# Patient Record
Sex: Male | Born: 1985 | Race: White | Hispanic: No | Marital: Married | Smoking: Never smoker
Health system: Southern US, Community
[De-identification: ages and names within clinical notes are randomized; demographics above are authoritative.]

---

## 2008-12-09 ENCOUNTER — Emergency Department (HOSPITAL_COMMUNITY): Admission: EM | Admit: 2008-12-09 | Discharge: 2008-12-09 | Payer: Self-pay | Admitting: Emergency Medicine

## 2011-04-09 ENCOUNTER — Emergency Department (HOSPITAL_COMMUNITY)
Admission: EM | Admit: 2011-04-09 | Discharge: 2011-04-09 | Disposition: A | Discharge status: Home / Self Care | Payer: Self-pay | Attending: Emergency Medicine | Admitting: Emergency Medicine

## 2011-04-09 DIAGNOSIS — W57XXXA Bitten or stung by nonvenomous insect and other nonvenomous arthropods, initial encounter: Secondary | ICD-10-CM | POA: Insufficient documentation

## 2011-04-09 DIAGNOSIS — R21 Rash and other nonspecific skin eruption: Secondary | ICD-10-CM | POA: Insufficient documentation

## 2011-04-09 DIAGNOSIS — R509 Fever, unspecified: Secondary | ICD-10-CM | POA: Insufficient documentation

## 2011-04-09 DIAGNOSIS — T148 Other injury of unspecified body region: Secondary | ICD-10-CM | POA: Insufficient documentation

## 2011-04-09 DIAGNOSIS — R197 Diarrhea, unspecified: Secondary | ICD-10-CM | POA: Insufficient documentation

## 2011-04-09 LAB — DIFFERENTIAL
Basophils Absolute: 0 10*3/uL (ref 0.0–0.1)
Lymphocytes Relative: 37 % (ref 12–46)
Lymphs Abs: 1.4 10*3/uL (ref 0.7–4.0)
Neutro Abs: 1.6 10*3/uL — ABNORMAL LOW (ref 1.7–7.7)
Neutrophils Relative %: 43 % (ref 43–77)

## 2011-04-09 LAB — CBC
HCT: 44.6 % (ref 39.0–52.0)
MCV: 84 fL (ref 78.0–100.0)
RBC: 5.31 MIL/uL (ref 4.22–5.81)
WBC: 3.8 10*3/uL — ABNORMAL LOW (ref 4.0–10.5)

## 2011-04-09 LAB — BASIC METABOLIC PANEL
BUN: 13 mg/dL (ref 6–23)
Chloride: 104 mEq/L (ref 96–112)
Glucose, Bld: 94 mg/dL (ref 70–99)
Potassium: 4.3 mEq/L (ref 3.5–5.1)
Sodium: 141 mEq/L (ref 135–145)

## 2011-04-13 LAB — ROCKY MTN SPOTTED FVR AB, IGM-BLOOD: RMSF IgM: 0.24 IV (ref 0.00–0.89)

## 2013-01-12 ENCOUNTER — Emergency Department: Payer: Self-pay | Admitting: Emergency Medicine

## 2013-09-21 ENCOUNTER — Emergency Department: Payer: Self-pay | Admitting: Emergency Medicine

## 2013-09-24 LAB — BETA STREP CULTURE(ARMC)

## 2013-12-15 ENCOUNTER — Emergency Department: Payer: Self-pay | Admitting: Emergency Medicine

## 2014-07-23 ENCOUNTER — Emergency Department: Payer: Self-pay | Admitting: Emergency Medicine

## 2015-05-23 ENCOUNTER — Emergency Department (HOSPITAL_BASED_OUTPATIENT_CLINIC_OR_DEPARTMENT_OTHER)
Admission: EM | Admit: 2015-05-23 | Discharge: 2015-05-23 | Disposition: A | Discharge status: Home / Self Care | Payer: Self-pay | Attending: Emergency Medicine | Admitting: Emergency Medicine

## 2015-05-23 ENCOUNTER — Encounter (HOSPITAL_BASED_OUTPATIENT_CLINIC_OR_DEPARTMENT_OTHER): Payer: Self-pay

## 2015-05-23 ENCOUNTER — Emergency Department (HOSPITAL_BASED_OUTPATIENT_CLINIC_OR_DEPARTMENT_OTHER): Payer: Self-pay

## 2015-05-23 DIAGNOSIS — R42 Dizziness and giddiness: Secondary | ICD-10-CM | POA: Insufficient documentation

## 2015-05-23 DIAGNOSIS — R0602 Shortness of breath: Secondary | ICD-10-CM | POA: Insufficient documentation

## 2015-05-23 DIAGNOSIS — R079 Chest pain, unspecified: Secondary | ICD-10-CM | POA: Insufficient documentation

## 2015-05-23 LAB — CBC WITH DIFFERENTIAL/PLATELET
BASOS ABS: 0 10*3/uL (ref 0.0–0.1)
BASOS PCT: 1 % (ref 0–1)
Eosinophils Absolute: 0.2 10*3/uL (ref 0.0–0.7)
Eosinophils Relative: 3 % (ref 0–5)
HEMATOCRIT: 45.2 % (ref 39.0–52.0)
HEMOGLOBIN: 15.3 g/dL (ref 13.0–17.0)
Lymphocytes Relative: 36 % (ref 12–46)
Lymphs Abs: 2.2 10*3/uL (ref 0.7–4.0)
MCH: 28.7 pg (ref 26.0–34.0)
MCHC: 33.8 g/dL (ref 30.0–36.0)
MCV: 84.8 fL (ref 78.0–100.0)
Monocytes Absolute: 0.6 10*3/uL (ref 0.1–1.0)
Monocytes Relative: 10 % (ref 3–12)
NEUTROS ABS: 3 10*3/uL (ref 1.7–7.7)
NEUTROS PCT: 50 % (ref 43–77)
PLATELETS: 207 10*3/uL (ref 150–400)
RBC: 5.33 MIL/uL (ref 4.22–5.81)
RDW: 13.2 % (ref 11.5–15.5)
WBC: 6.1 10*3/uL (ref 4.0–10.5)

## 2015-05-23 LAB — BASIC METABOLIC PANEL
Anion gap: 9 (ref 5–15)
BUN: 16 mg/dL (ref 6–20)
CALCIUM: 8.7 mg/dL — AB (ref 8.9–10.3)
CHLORIDE: 107 mmol/L (ref 101–111)
CO2: 26 mmol/L (ref 22–32)
CREATININE: 0.97 mg/dL (ref 0.61–1.24)
GFR calc Af Amer: >60 mL/min (ref >60)
GFR calc non Af Amer: >60 mL/min (ref >60)
GLUCOSE: 95 mg/dL (ref 65–99)
Potassium: 3.5 mmol/L (ref 3.5–5.1)
SODIUM: 142 mmol/L (ref 135–145)

## 2015-05-23 LAB — CBG MONITORING, ED: GLUCOSE-CAPILLARY: 82 mg/dL (ref 65–99)

## 2015-05-23 LAB — TROPONIN I: Troponin I: <0.03 ng/mL (ref <0.031)

## 2015-05-23 MED ORDER — SODIUM CHLORIDE 0.9 % IV BOLUS (SEPSIS)
1000.0000 mL | Freq: Once | INTRAVENOUS | Status: AC
  Administered 2015-05-23: 1000 mL via INTRAVENOUS

## 2015-05-23 NOTE — ED Notes (Signed)
MD at bedside discussing results with patient 

## 2015-05-23 NOTE — ED Notes (Signed)
Reports lightheadedness and dizziness that started at 1130 pm. Sts episode 2 weeks ago. Denies pain. Reports last ate at 7pm

## 2015-05-23 NOTE — Discharge Instructions (Signed)
°Chest Pain (Nonspecific) °It is often hard to give a specific diagnosis for the cause of chest pain. There is always a chance that your pain could be related to something serious, such as a heart attack or a blood clot in the lungs. You need to follow up with your health care provider for further evaluation. °CAUSES  °· Heartburn. °· Pneumonia or bronchitis. °· Anxiety or stress. °· Inflammation around your heart (pericarditis) or lung (pleuritis or pleurisy). °· A blood clot in the lung. °· A collapsed lung (pneumothorax). It can develop suddenly on its own (spontaneous pneumothorax) or from trauma to the chest. °· Shingles infection (herpes zoster virus). °The chest wall is composed of bones, muscles, and cartilage. Any of these can be the source of the pain. °· The bones can be bruised by injury. °· The muscles or cartilage can be strained by coughing or overwork. °· The cartilage can be affected by inflammation and become sore (costochondritis). °DIAGNOSIS  °Lab tests or other studies may be needed to find the cause of your pain. Your health care provider may have you take a test called an ambulatory electrocardiogram (ECG). An ECG records your heartbeat patterns over a 24-hour period. You may also have other tests, such as: °· Transthoracic echocardiogram (TTE). During echocardiography, sound waves are used to evaluate how blood flows through your heart. °· Transesophageal echocardiogram (TEE). °· Cardiac monitoring. This allows your health care provider to monitor your heart rate and rhythm in real time. °· Holter monitor. This is a portable device that records your heartbeat and can help diagnose heart arrhythmias. It allows your health care provider to track your heart activity for several days, if needed. °· Stress tests by exercise or by giving medicine that makes the heart beat faster. °TREATMENT  °· Treatment depends on what may be causing your chest pain. Treatment may include: °· Acid blockers for  heartburn. °· Anti-inflammatory medicine. °· Pain medicine for inflammatory conditions. °· Antibiotics if an infection is present. °· You may be advised to change lifestyle habits. This includes stopping smoking and avoiding alcohol, caffeine, and chocolate. °· You may be advised to keep your head raised (elevated) when sleeping. This reduces the chance of acid going backward from your stomach into your esophagus. °Most of the time, nonspecific chest pain will improve within 2-3 days with rest and mild pain medicine.  °HOME CARE INSTRUCTIONS  °· If antibiotics were prescribed, take them as directed. Finish them even if you start to feel better. °· For the next few days, avoid physical activities that bring on chest pain. Continue physical activities as directed. °· Do not use any tobacco products, including cigarettes, chewing tobacco, or electronic cigarettes. °· Avoid drinking alcohol. °· Only take medicine as directed by your health care provider. °· Follow your health care provider's suggestions for further testing if your chest pain does not go away. °· Keep any follow-up appointments you made. If you do not go to an appointment, you could develop lasting (chronic) problems with pain. If there is any problem keeping an appointment, call to reschedule. °SEEK MEDICAL CARE IF:  °· Your chest pain does not go away, even after treatment. °· You have a rash with blisters on your chest. °· You have a fever. °SEEK IMMEDIATE MEDICAL CARE IF:  °· You have increased chest pain or pain that spreads to your arm, neck, jaw, back, or abdomen. °· You have shortness of breath. °· You have an increasing cough, or you cough   up blood. °· You have severe back or abdominal pain. °· You feel nauseous or vomit. °· You have severe weakness. °· You faint. °· You have chills. °This is an emergency. Do not wait to see if the pain will go away. Get medical help at once. Call your local emergency services (911 in [place] Do not drive  yourself to the hospital. °MAKE SURE YOU:  °· Understand these instructions. °· Will watch your condition. °· Will get help right away if you are not doing well or get worse. °Document Released: 08/19/2005 Document Revised: 11/14/2013 Document Reviewed: 06/14/2008 °[employer]® Patient Information ©2015 [employer]. This information is not intended to replace advice given to you by your health care provider. Make sure you discuss any questions you have with your health care provider. ° ° °Dizziness °Dizziness is a common problem. It is a feeling of unsteadiness or light-headedness. You may feel like you are about to faint. Dizziness can lead to injury if you stumble or fall. A person of any age group can suffer from dizziness, but dizziness is more common in older adults. °CAUSES  °Dizziness can be caused by many different things, including: °· Middle ear problems. °· Standing for too long. °· Infections. °· An allergic reaction. °· Aging. °· An emotional response to something, such as the sight of blood. °· Side effects of medicines. °· Tiredness. °· Problems with circulation or blood pressure. °· Excessive use of alcohol or medicines, or illegal drug use. °· Breathing too fast (hyperventilation). °· An irregular heart rhythm (arrhythmia). °· A low red blood cell count (anemia). °· Pregnancy. °· Vomiting, diarrhea, fever, or other illnesses that cause body fluid loss (dehydration). °· Diseases or conditions such as Parkinson's disease, high blood pressure (hypertension), diabetes, and thyroid problems. °· Exposure to extreme heat. °DIAGNOSIS  °Your health care provider will ask about your symptoms, perform a physical exam, and perform an electrocardiogram (ECG) to record the electrical activity of your heart. Your health care provider may also perform other heart or blood tests to determine the cause of your dizziness. These may include: °· Transthoracic echocardiogram (TTE). During echocardiography, sound waves are  used to evaluate how blood flows through your heart. °· Transesophageal echocardiogram (TEE). °· Cardiac monitoring. This allows your health care provider to monitor your heart rate and rhythm in real time. °· Holter monitor. This is a portable device that records your heartbeat and can help diagnose heart arrhythmias. It allows your health care provider to track your heart activity for several days if needed. °· Stress tests by exercise or by giving medicine that makes the heart beat faster. °TREATMENT  °Treatment of dizziness depends on the cause of your symptoms and can vary greatly. °HOME CARE INSTRUCTIONS  °· Drink enough fluids to keep your urine clear or pale yellow. This is especially important in very hot weather. In older adults, it is also important in cold weather. °· Take your medicine exactly as directed if your dizziness is caused by medicines. When taking blood pressure medicines, it is especially important to get up slowly. °¨ Rise slowly from chairs and steady yourself until you feel okay. °¨ In the morning, first sit up on the side of the bed. When you feel okay, stand slowly while holding onto something until you know your balance is fine. °· Move your legs often if you need to stand in one place for a long time. Tighten and relax your muscles in your legs while standing. °· Have someone stay   with you for 1-2 days if dizziness continues to be a problem. Do this until you feel you are well enough to stay alone. Have the person call your health care provider if he or she notices changes in you that are concerning. °· Do not drive or use heavy machinery if you feel dizzy. °· Do not drink alcohol. °SEEK IMMEDIATE MEDICAL CARE IF:  °· Your dizziness or light-headedness gets worse. °· You feel nauseous or vomit. °· You have problems talking, walking, or using your arms, hands, or legs. °· You feel weak. °· You are not thinking clearly or you have trouble forming sentences. It may take a friend or  family member to notice this. °· You have chest pain, abdominal pain, shortness of breath, or sweating. °· Your vision changes. °· You notice any bleeding. °· You have side effects from medicine that seems to be getting worse rather than better. °MAKE SURE YOU:  °· Understand these instructions. °· Will watch your condition. °· Will get help right away if you are not doing well or get worse. °Document Released: 05/05/2001 Document Revised: 11/14/2013 Document Reviewed: 05/29/2011 °[employer]® Patient Information ©2015 [employer]. This information is not intended to replace advice given to you by your health care provider. Make sure you discuss any questions you have with your health care provider. ° ° °

## 2015-05-23 NOTE — ED Provider Notes (Signed)
TIME SEEN: 12:45 AM  CHIEF COMPLAINT: Chest pain, shortness of breath, lightheadedness  HPI: Pt is a 29 y.o. male with no significant past medical history who presents to the emergency department with an episode of chest heaviness in the left side without radiation, shortness of breath and lightheadedness that lasted for approximately 10 minutes while at work. Patient states he was resting when symptoms occurred. He reports he has been lightheaded today that is worse with standing. He states that he felt cold, sweaty during this episode. States his boss told him to come to the emergency department because they thought his blood sugar may be low. He denies any history of hypertension, diabetes, hyperlipidemia, tobacco use. No family history of premature CAD. Denies history of PE or DVT, recent prolonged immobilization such as long flight or hospitals age and fracture, surgery, trauma. He has had one similar episode 2 weeks ago. States he is feeling better but still having some intermittent lightheadedness. No vertigo.  ROS: See HPI Constitutional: no fever  Eyes: no drainage  ENT: no runny nose   Cardiovascular:   chest pain  Resp:  SOB  GI: no vomiting GU: no dysuria Integumentary: no rash  Allergy: no hives  Musculoskeletal: no leg swelling  Neurological: no slurred speech ROS otherwise negative  PAST MEDICAL HISTORY/PAST SURGICAL HISTORY:  History reviewed. No pertinent past medical history.  MEDICATIONS:  Prior to Admission medications   Not on File    ALLERGIES:  No Known Allergies  SOCIAL HISTORY:  History  Substance Use Topics  . Smoking status: Never Smoker   . Smokeless tobacco: Not on file  . Alcohol Use: Yes     Comment: socialy    FAMILY HISTORY: No family history on file.  EXAM: BP 129/78 mmHg  Pulse 68  Temp(Src) 97.8 F (36.6 C) (Oral)  Resp 16  Ht 5\' 11"  (1.803 m)  Wt 213 lb (96.616 kg)  BMI 29.72 kg/m2  SpO2 98% CONSTITUTIONAL: Alert and oriented  and responds appropriately to questions. Well-appearing; well-nourished, smiling, pleasant, in no distress HEAD: Normocephalic EYES: Conjunctivae clear, PERRL ENT: normal nose; no rhinorrhea; moist mucous membranes; pharynx without lesions noted NECK: Supple, no meningismus, no LAD  CARD: RRR; S1 and S2 appreciated; no murmurs, no clicks, no rubs, no gallops RESP: Normal chest excursion without splinting or tachypnea; breath sounds clear and equal bilaterally; no wheezes, no rhonchi, no rales, no hypoxia or respiratory distress, speaking full sentences ABD/GI: Normal bowel sounds; non-distended; soft, non-tender, no rebound, no guarding, no peritoneal signs BACK:  The back appears normal and is non-tender to palpation, there is no CVA tenderness EXT: Normal ROM in all joints; non-tender to palpation; no edema; normal capillary refill; no cyanosis, no calf tenderness or swelling    SKIN: Normal color for age and race; warm NEURO: Moves all extremities equally, sensation to light touch intact diffusely, cranial nerves II through XII intact, normal gait PSYCH: The patient's mood and manner are appropriate. Grooming and personal hygiene are appropriate.  MEDICAL DECISION MAKING: Patient here with episode of chest pain, shortness of breath and lightheadedness while at work. May be related to anxiety, panic attack. Patient is hemodynamically stable in the ED, neurologically intact. Blood glucose is normal. He is not orthostatic. He has no risk factors for ACS or pulmonary embolus. He is PERC negative with HEART score of 0. Will obtain labs, chest x-ray. EKG shows no ischemic changes. He has a sinus arrhythmia but no interval changes. We'll give IV fluids  for symptom control. Anticipate if workup is unremarkable he will be able to be discharged home.  ED PROGRESS: Patient reports feeling much better after IV fluids. His labs today are unremarkable including normal hemoglobin, normal electrolytes and  negative troponin. Chest x-ray is clear. I feel he is safe to be discharged home. I do not feel he has any life-threatening illness at this time or needs any further emergent workup. Have discussed at length strict return precautions and increasing fluid intake, avoiding caffeine and alcohol. Will provide work note for [employer]. He verbalized understanding and is comfortable with this plan.    EKG Interpretation  Date/Time:  Thursday May 23 2015 01:00:55 EDT Ventricular Rate:  61 PR Interval:  140 QRS Duration: 108 QT Interval:  440 QTC Calculation: 442 R Axis:   55 Text Interpretation:  Normal sinus rhythm with sinus arrhythmia Normal ECG No old tracing to compare Confirmed by Martavis Gurney,  DO, Jaquann Guarisco [phone]) on 05/23/2015 1:10:33 AM        Layla Maw Tiburcio Linder, DO 05/23/15 [id number]

## 2015-06-20 ENCOUNTER — Encounter (HOSPITAL_COMMUNITY): Payer: Self-pay | Admitting: Vascular Surgery

## 2015-06-20 DIAGNOSIS — R001 Bradycardia, unspecified: Secondary | ICD-10-CM | POA: Insufficient documentation

## 2015-06-20 DIAGNOSIS — R51 Headache: Secondary | ICD-10-CM | POA: Insufficient documentation

## 2015-06-20 NOTE — ED Notes (Signed)
Pt reports to the ED for eval of lightheadedness, dizziness, and headaches. Reports the symptoms began yesterday. Pt reports a similar episode happened approx a month ago and he had a negative work up. Grips equal and no facial droop. Reports light makes the pain worse. Pt A&Ox4, resp e/u, and skin warm and dry.

## 2015-06-20 NOTE — ED Notes (Signed)
Called in waiting room, family states he was in bathroom

## 2015-06-21 ENCOUNTER — Emergency Department (HOSPITAL_COMMUNITY): Payer: Self-pay

## 2015-06-21 ENCOUNTER — Emergency Department (HOSPITAL_COMMUNITY)
Admission: EM | Admit: 2015-06-21 | Discharge: 2015-06-21 | Disposition: A | Discharge status: Home / Self Care | Payer: Self-pay | Attending: Emergency Medicine | Admitting: Emergency Medicine

## 2015-06-21 DIAGNOSIS — R001 Bradycardia, unspecified: Secondary | ICD-10-CM

## 2015-06-21 LAB — RAPID URINE DRUG SCREEN, HOSP PERFORMED
Amphetamines: NOT DETECTED
BARBITURATES: NOT DETECTED
BENZODIAZEPINES: NOT DETECTED
COCAINE: NOT DETECTED
OPIATES: NOT DETECTED
TETRAHYDROCANNABINOL: NOT DETECTED

## 2015-06-21 LAB — CBC WITH DIFFERENTIAL/PLATELET
BASOS PCT: 0 % (ref 0–1)
Basophils Absolute: 0 10*3/uL (ref 0.0–0.1)
EOS ABS: 0.1 10*3/uL (ref 0.0–0.7)
EOS PCT: 2 % (ref 0–5)
HCT: 45.8 % (ref 39.0–52.0)
Hemoglobin: 15.9 g/dL (ref 13.0–17.0)
Lymphocytes Relative: 30 % (ref 12–46)
Lymphs Abs: 1.8 10*3/uL (ref 0.7–4.0)
MCH: 29.6 pg (ref 26.0–34.0)
MCHC: 34.7 g/dL (ref 30.0–36.0)
MCV: 85.1 fL (ref 78.0–100.0)
Monocytes Absolute: 0.5 10*3/uL (ref 0.1–1.0)
Monocytes Relative: 9 % (ref 3–12)
Neutro Abs: 3.5 10*3/uL (ref 1.7–7.7)
Neutrophils Relative %: 59 % (ref 43–77)
Platelets: 205 10*3/uL (ref 150–400)
RBC: 5.38 MIL/uL (ref 4.22–5.81)
RDW: 13.1 % (ref 11.5–15.5)
WBC: 6 10*3/uL (ref 4.0–10.5)

## 2015-06-21 LAB — BASIC METABOLIC PANEL
Anion gap: 7 (ref 5–15)
BUN: 15 mg/dL (ref 6–20)
CO2: 27 mmol/L (ref 22–32)
CREATININE: 1.05 mg/dL (ref 0.61–1.24)
Calcium: 8.7 mg/dL — ABNORMAL LOW (ref 8.9–10.3)
Chloride: 108 mmol/L (ref 101–111)
GFR calc Af Amer: >60 mL/min (ref >60)
GLUCOSE: 95 mg/dL (ref 65–99)
POTASSIUM: 3.8 mmol/L (ref 3.5–5.1)
Sodium: 142 mmol/L (ref 135–145)

## 2015-06-21 LAB — URINALYSIS, ROUTINE W REFLEX MICROSCOPIC
Glucose, UA: NEGATIVE mg/dL
Hgb urine dipstick: NEGATIVE
Ketones, ur: 15 mg/dL — AB
Leukocytes, UA: NEGATIVE
Nitrite: NEGATIVE
Protein, ur: NEGATIVE mg/dL
Specific Gravity, Urine: 1.036 — ABNORMAL HIGH (ref 1.005–1.030)
Urobilinogen, UA: 0.2 mg/dL (ref 0.0–1.0)
pH: 5.5 (ref 5.0–8.0)

## 2015-06-21 LAB — I-STAT TROPONIN, ED: Troponin i, poc: 0 ng/mL (ref 0.00–0.08)

## 2015-06-21 MED ORDER — KETOROLAC TROMETHAMINE 30 MG/ML IJ SOLN
30.0000 mg | Freq: Once | INTRAMUSCULAR | Status: AC
  Administered 2015-06-21: 30 mg via INTRAVENOUS
  Filled 2015-06-21: qty 1

## 2015-06-21 MED ORDER — SODIUM CHLORIDE 0.9 % IV BOLUS (SEPSIS)
1000.0000 mL | Freq: Once | INTRAVENOUS | Status: AC
Start: 2015-06-21 — End: 2015-06-21
  Administered 2015-06-21: 1000 mL via INTRAVENOUS

## 2015-06-21 MED ORDER — METOCLOPRAMIDE HCL 5 MG/ML IJ SOLN
10.0000 mg | Freq: Once | INTRAMUSCULAR | Status: AC
  Administered 2015-06-21: 10 mg via INTRAVENOUS
  Filled 2015-06-21: qty 2

## 2015-06-21 NOTE — ED Notes (Signed)
Ambulated pt down the hallway and back. Pt's HR was 76 to begin with and rose to 92 and was steady. Pt back in room now and HR 56

## 2015-06-21 NOTE — ED Notes (Signed)
Pt verbalized understanding of d/c instructions and to follow up with a cardiologist. Pt has no further questions is stable and in NAD.

## 2015-06-21 NOTE — ED Notes (Signed)
Pt returned from xray placed back on the monitor.

## 2015-06-21 NOTE — ED Provider Notes (Signed)
CSN: [id number]     Arrival date & time 06/20/15  2159 History  This chart was scribed for Andrew Crumble, MD by Leone Payor, ED Scribe. This patient was seen in room B16C/B16C and the patient's care was started 12:55 AM.    Chief Complaint  Patient presents with  . Dizziness  . Headache    The history is provided by the patient. No language interpreter was used.    HPI Comments: Andrew Nash is a 29 y.o. male who presents to the Emergency Department complaining of a HA and intermittent dizziness that began yesterday while at work. He describes the HA as pressure to the back of the head. He also reports occasional left eye blurriness for the past couple of weeks. He reports associated nausea but no vomiting. He had a similar episode of dizziness but without HA last month for which he was seen at [facility]. 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 He denies weakness, numbness, bowel or bladder incontinence, fever, chills, vomiting.    History reviewed. No pertinent past medical history. History reviewed. No pertinent past surgical history. No family history on file. History  Substance Use Topics  . Smoking status: Never Smoker   . Smokeless tobacco: Not on file  . Alcohol Use: Yes     Comment: socialy    Review of Systems  10 Systems reviewed and all are negative for acute change except as noted in the HPI.    Allergies  Review of patient's allergies indicates no known allergies.  Home Medications   Prior to Admission medications   Not on File   BP 125/85 mmHg  Pulse 76  Temp(Src) 98.3 F (36.8 C) (Oral)  Resp 16  Ht 5\' 11"  (1.803 m)  Wt 208 lb 1.6 oz (94.394 kg)  BMI 29.04 kg/m2  SpO2 98% Physical Exam  Constitutional: He is oriented to person, place, and time. Vital signs are normal. He appears well-developed and well-nourished.  Non-toxic appearance. He does not appear ill.  No distress.  HENT:  Head: Normocephalic and atraumatic.  Nose: Nose normal.  Mouth/Throat: Oropharynx is clear and moist. No oropharyngeal exudate.  Eyes: Conjunctivae and EOM are normal. Pupils are equal, round, and reactive to light. No scleral icterus.  Neck: Normal range of motion. Neck supple. No tracheal deviation, no edema, no erythema and normal range of motion present. No thyroid mass and no thyromegaly present.  Cardiovascular: Regular rhythm, S1 normal, S2 normal, normal heart sounds, intact distal pulses and normal pulses.  Bradycardia present.  Exam reveals no gallop and no friction rub.   No murmur heard. Pulses:      Radial pulses are 2+ on the right side, and 2+ on the left side.       Dorsalis pedis pulses are 2+ on the right side, and 2+ on the left side.  Pulmonary/Chest: Effort normal and breath sounds normal. No respiratory distress. He has no wheezes. He has no rhonchi. He has no rales.  Abdominal: Soft. Normal appearance and bowel sounds are normal. He exhibits no distension, no ascites and no mass. There is no hepatosplenomegaly. There is no tenderness. There is no rebound, no guarding and no CVA tenderness.  Musculoskeletal: Normal range of motion. He exhibits no edema or tenderness.  Lymphadenopathy:    He has no cervical adenopathy.  Neurological: He is alert and oriented to person, place, and  time. He has normal strength. No cranial nerve deficit or sensory deficit.  Normal strength and sensation in all extremities. Normal cerebellar testing.   Skin: Skin is warm, dry and intact. No petechiae and no rash noted. He is not diaphoretic. No erythema. No pallor.  Psychiatric: He has a normal mood and affect. His behavior is normal. Judgment normal.  Nursing note and vitals reviewed.   ED Course  Procedures (including critical care time)  DIAGNOSTIC STUDIES: Oxygen Saturation is 99% on RA, normal by my interpretation.    COORDINATION OF CARE: 1:00 AM Will order  labs and CXR. Discussed treatment plan with pt at bedside and pt agreed to plan.   Labs Review Labs Reviewed  BASIC METABOLIC PANEL - Abnormal; Notable for the following:    Calcium 8.7 (*)    All other components within normal limits  URINALYSIS, ROUTINE W REFLEX MICROSCOPIC (NOT AT [facility]) - Abnormal; Notable for the following:    Color, Urine AMBER (*)    Specific Gravity, Urine 1.036 (*)    Bilirubin Urine SMALL (*)    Ketones, ur 15 (*)    All other components within normal limits  CBC WITH DIFFERENTIAL/PLATELET  URINE RAPID DRUG SCREEN, HOSP PERFORMED  ETHANOL  I-STAT TROPOININ, ED    Imaging Review Dg Chest 2 View  06/21/2015   CLINICAL DATA:  Lightheadedness.  Dyspnea.  EXAM: CHEST  2 VIEW  COMPARISON:  05/23/2015  FINDINGS: The heart size and mediastinal contours are within normal limits. Both lungs are clear. The visualized skeletal structures are unremarkable.  IMPRESSION: No active cardiopulmonary disease.   Electronically Signed   By: Ellery Plunk M.D.   On: 06/21/2015 02:14     EKG Interpretation   Date/Time:  Friday June 21 2015 01:08:52 EDT Ventricular Rate:  57 PR Interval:  140 QRS Duration: 105 QT Interval:  457 QTC Calculation: 445 R Axis:   63 Text Interpretation:  Sinus bradycardia No significant change since last  tracing Confirmed by Erroll Luna [id number] on 06/21/2015 1:38:26 AM      MDM   Final diagnoses:  None   Patient presents emergency department for dizziness, lightheadedness and headaches. He is noted to be bradycardic since his time in the emergency department. His heart rate is in the 40s and 50s. I have paged cardiology for consultation. Emergency department workup is negative. Neurological exam here is normal as well.  EKG only reveals sinus bradycardia, no blocks.  I spoke with Dr. Bradly Bienenstock with cardiology who does not believe his lightheadedness is due to his low heart rate as a 30 year old other wise healthy young male. He  is advised to ablate the patient, his heart rate did rise to the 90s. When he goes back to rest, and falls back to the 50s. At this time he states he feels better, his symptoms have resolved. He was advised to see cardiology within 3 days for close follow-up. He demonstrated understanding. His vital signs remain within his normal limits and he is safe for discharge.  I personally performed the services described in this documentation, which was scribed in my presence. The recorded information has been reviewed and is accurate.   Andrew Crumble, MD 06/21/15 [phone]

## 2015-06-21 NOTE — Discharge Instructions (Signed)
Dizziness Andrew Nash, be sure to stay well-hydrated every day while working outside. See cardiology within 3 days for close follow-up. If symptoms worsen come back to emergency department immediately. Thank you.  Dizziness means you feel unsteady or lightheaded. You might feel like you are going to pass out (faint). HOME CARE   Drink enough fluids to keep your pee (urine) clear or pale yellow.  Take your medicines exactly as told by your doctor. If you take blood pressure medicine, always stand up slowly from the lying or sitting position. Hold on to something to steady yourself.  If you need to stand in one place for a long time, move your legs often. Tighten and relax your leg muscles.  Have someone stay with you until you feel okay.  Do not drive or use heavy machinery if you feel dizzy.  Do not drink alcohol. GET HELP RIGHT AWAY IF:   You feel dizzy or lightheaded and it gets worse.  You feel sick to your stomach (nauseous), or you throw up (vomit).  You have trouble talking or walking.  You feel weak or have trouble using your arms, hands, or legs.  You cannot think clearly or have trouble forming sentences.  You have chest pain, belly (abdominal) pain, sweating, or you are short of breath.  Your vision changes.  You are bleeding.  You have problems from your medicine that seem to be getting worse. MAKE SURE YOU:   Understand these instructions.  Will watch your condition.  Will get help right away if you are not doing well or get worse. Document Released: 10/29/2011 Document Revised: 02/01/2012 Document Reviewed: 10/29/2011 [facility] Patient Information 2015 [place], [place]. This information is not intended to replace advice given to you by your health care provider. Make sure you discuss any questions you have with your health care provider.

## 2015-12-13 ENCOUNTER — Emergency Department (HOSPITAL_COMMUNITY)
Admission: EM | Admit: 2015-12-13 | Discharge: 2015-12-13 | Disposition: A | Discharge status: Home / Self Care | Payer: Managed Care, Other (non HMO)

## 2015-12-13 ENCOUNTER — Encounter (HOSPITAL_COMMUNITY): Payer: Self-pay | Admitting: *Deleted

## 2015-12-13 ENCOUNTER — Emergency Department (HOSPITAL_COMMUNITY)
Admission: EM | Admit: 2015-12-13 | Discharge: 2015-12-13 | Disposition: A | Discharge status: Home / Self Care | Payer: Managed Care, Other (non HMO) | Attending: Emergency Medicine | Admitting: Emergency Medicine

## 2015-12-13 DIAGNOSIS — Y92410 Unspecified street and highway as the place of occurrence of the external cause: Secondary | ICD-10-CM | POA: Diagnosis not present

## 2015-12-13 DIAGNOSIS — Y999 Unspecified external cause status: Secondary | ICD-10-CM | POA: Diagnosis not present

## 2015-12-13 DIAGNOSIS — Y9389 Activity, other specified: Secondary | ICD-10-CM | POA: Diagnosis not present

## 2015-12-13 DIAGNOSIS — M545 Low back pain, unspecified: Secondary | ICD-10-CM

## 2015-12-13 DIAGNOSIS — S3992XA Unspecified injury of lower back, initial encounter: Secondary | ICD-10-CM | POA: Diagnosis present

## 2015-12-13 MED ORDER — IBUPROFEN 800 MG PO TABS: 800.0000 mg | ORAL_TABLET | Freq: Three times a day (TID) | ORAL | Status: DC

## 2015-12-13 MED ORDER — TRAMADOL HCL 50 MG PO TABS: 50.0000 mg | ORAL_TABLET | Freq: Four times a day (QID) | ORAL | Status: DC | PRN

## 2015-12-13 MED ORDER — CYCLOBENZAPRINE HCL 10 MG PO TABS: 10.0000 mg | ORAL_TABLET | Freq: Three times a day (TID) | ORAL | Status: DC | PRN
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
## 2015-12-13 NOTE — Discharge Instructions (Signed)
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
## 2017-05-28 ENCOUNTER — Encounter (HOSPITAL_COMMUNITY): Payer: Self-pay | Admitting: *Deleted

## 2017-05-28 ENCOUNTER — Emergency Department (HOSPITAL_COMMUNITY)
Admission: EM | Admit: 2017-05-28 | Discharge: 2017-05-28 | Disposition: A | Discharge status: Home / Self Care | Payer: Managed Care, Other (non HMO) | Attending: Emergency Medicine | Admitting: Emergency Medicine

## 2017-05-28 DIAGNOSIS — R21 Rash and other nonspecific skin eruption: Secondary | ICD-10-CM | POA: Insufficient documentation

## 2017-05-28 DIAGNOSIS — L500 Allergic urticaria: Secondary | ICD-10-CM | POA: Insufficient documentation

## 2017-05-28 DIAGNOSIS — T7840XA Allergy, unspecified, initial encounter: Secondary | ICD-10-CM

## 2017-05-28 MED ORDER — DEXAMETHASONE SODIUM PHOSPHATE 10 MG/ML IJ SOLN
10.0000 mg | Freq: Once | INTRAMUSCULAR | Status: AC
  Administered 2017-05-28: 10 mg via INTRAVENOUS
  Filled 2017-05-28: qty 1

## 2017-05-28 MED ORDER — SODIUM CHLORIDE 0.9 % IV BOLUS (SEPSIS)
1000.0000 mL | Freq: Once | INTRAVENOUS | Status: AC
  Administered 2017-05-28: 1000 mL via INTRAVENOUS

## 2017-05-28 MED ORDER — DIPHENHYDRAMINE HCL 50 MG/ML IJ SOLN
25.0000 mg | Freq: Once | INTRAMUSCULAR | Status: AC
  Administered 2017-05-28: 25 mg via INTRAVENOUS
  Filled 2017-05-28: qty 1

## 2017-05-28 MED ORDER — EPINEPHRINE 0.3 MG/0.3ML IJ SOAJ: 0.3000 mg | Freq: Once | INTRAMUSCULAR | 1 refills | Status: AC

## 2017-05-28 MED ORDER — DIPHENHYDRAMINE HCL 25 MG PO TABS: 25.0000 mg | ORAL_TABLET | Freq: Three times a day (TID) | ORAL | 0 refills | Status: DC

## 2017-05-28 MED ORDER — FAMOTIDINE 20 MG PO TABS: 20.0000 mg | ORAL_TABLET | Freq: Two times a day (BID) | ORAL | 0 refills | Status: DC

## 2017-05-28 MED ORDER — FAMOTIDINE IN NACL 20-0.9 MG/50ML-% IV SOLN
20.0000 mg | Freq: Once | INTRAVENOUS | Status: AC
  Administered 2017-05-28: 20 mg via INTRAVENOUS
  Filled 2017-05-28: qty 50

## 2017-05-28 NOTE — Discharge Instructions (Signed)
As discussed, it is important that you monitor your condition carefully, and take all medication as directed. Be sure to return here for concerning changes in your condition.

## 2017-05-28 NOTE — ED Triage Notes (Signed)
Pt in reports being bitten at work 1 hr ago by unknown insect, pt has significant hives over entire back with redness, pt denies current SOB, pt ambulatory, pt reports excessive itching, airway intact

## 2017-05-28 NOTE — ED Provider Notes (Signed)
[facility]-EMERGENCY DEPT Provider Note   CSN: [id number] Arrival date & time: 05/28/17  [id number]     History   Chief Complaint Chief Complaint  Patient presents with  . Allergic Reaction    HPI Andrew KeaRobert A Nash is a 31 y.o. male.  HPI  Patient presents immediately after sustaining an injury that resulted in a development of rash. Patient states that he is generally well aside from eczema-like condition, with chronic papular lesions on his extremities. Prior to the event the patient was in his usual state of health. He recalls being bitten by something, but he is unsure of what.  This occurred on his back. This occurred about one hour ago. Since that time he developed hives throughout his habitus, arms, back, chest. No dyspnea, no lightheadedness, no syncope, no fever, no chills.   History reviewed. No pertinent past medical history.  There are no active problems to display for this patient.   History reviewed. No pertinent surgical history.     Home Medications    Prior to Admission medications   Medication Sig Start Date End Date Taking? Authorizing Provider  cyclobenzaprine (FLEXERIL) 10 MG tablet Take 1 tablet (10 mg total) by mouth 3 (three) times daily as needed for muscle spasms. 12/13/15   Gilda CreasePollina, Christopher J, MD  ibuprofen (ADVIL,MOTRIN) 800 MG tablet Take 1 tablet (800 mg total) by mouth 3 (three) times daily. 12/13/15   Gilda CreasePollina, Christopher J, MD  traMADol (ULTRAM) 50 MG tablet Take 1 tablet (50 mg total) by mouth every 6 (six) hours as needed. 12/13/15   Gilda CreasePollina, Christopher J, MD    Family History No family history on file.  Social History Social History  Substance Use Topics  . Smoking status: Never Smoker  . Smokeless tobacco: Never Used  . Alcohol use Yes     Comment: socialy     Allergies   Patient has no known allergies.   Review of Systems Review of Systems  Constitutional:       Per HPI, otherwise negative  HENT:       Per HPI, otherwise  negative  Respiratory:       Per HPI, otherwise negative  Cardiovascular:       Per HPI, otherwise negative  Gastrointestinal: Negative for vomiting.  Endocrine:       Negative aside from HPI  Genitourinary:       Neg aside from HPI   Musculoskeletal:       Per HPI, otherwise negative  Skin: Positive for color change and rash.  Neurological: Negative for syncope.     Physical Exam Updated Vital Signs Ht 5\' 11"  (1.803 m)   Wt 88.5 kg (195 lb)   BMI 27.20 kg/m   Physical Exam  Constitutional: He is oriented to person, place, and time. He appears well-developed. No distress.  HENT:  Head: Normocephalic and atraumatic.  Mouth/Throat: Oropharynx is clear and moist. No oropharyngeal exudate.  Eyes: Conjunctivae and EOM are normal.  Cardiovascular: Normal rate and regular rhythm.   Pulmonary/Chest: Effort normal. No stridor. No respiratory distress.  Abdominal: He exhibits no distension.  Musculoskeletal: He exhibits no edema.  Neurological: He is alert and oriented to person, place, and time.  Skin: Skin is warm and dry. Rash noted.  Papular lesions throughout the lower extremities, no confluent erythema. Throughout the back, upper extremities, there are multiple areas of urticarial like lesion, with confluent erythema, slightly raised, nontender  Psychiatric: He has a normal mood and affect.  Nursing note  and vitals reviewed.    ED Treatments / Results   Procedures Procedures (including critical care time)  Medications Ordered in ED Medications  sodium chloride 0.9 % bolus 1,000 mL (not administered)  diphenhydrAMINE (BENADRYL) injection 25 mg (not administered)  famotidine (PEPCID) IVPB 20 mg premix (not administered)  dexamethasone (DECADRON) injection 10 mg (not administered)     Initial Impression / Assessment and Plan / ED Course  I have reviewed the triage vital signs and the nursing notes.  Pertinent labs & imaging results that were available during my  care of the patient were reviewed by me and considered in my medical decision making (see chart for details).  Immediately after the initial evaluation with concern for allergic reaction patient received fluids, Benadryl, Pepcid, steroids, was placed on monitoring equipment.  On repeat exam the patient's hives have improved, the patient is much better, has no respiratory complaints, is appropriate for discharge.  Final Clinical Impressions(s) / ED Diagnoses  Allergic reaction, initial encounter   Gerhard Munch, MD 05/28/17 1235

## 2017-06-30 ENCOUNTER — Emergency Department (HOSPITAL_COMMUNITY): Payer: Self-pay

## 2017-06-30 ENCOUNTER — Encounter (HOSPITAL_COMMUNITY): Payer: Self-pay | Admitting: *Deleted

## 2017-06-30 ENCOUNTER — Emergency Department (HOSPITAL_COMMUNITY)
Admission: EM | Admit: 2017-06-30 | Discharge: 2017-06-30 | Disposition: A | Discharge status: Home / Self Care | Payer: Self-pay | Attending: Emergency Medicine | Admitting: Emergency Medicine

## 2017-06-30 DIAGNOSIS — R0789 Other chest pain: Secondary | ICD-10-CM | POA: Insufficient documentation

## 2017-06-30 DIAGNOSIS — R0781 Pleurodynia: Secondary | ICD-10-CM

## 2017-06-30 LAB — I-STAT TROPONIN, ED: TROPONIN I, POC: 0.01 ng/mL (ref 0.00–0.08)

## 2017-06-30 LAB — BASIC METABOLIC PANEL
ANION GAP: 7 (ref 5–15)
BUN: 9 mg/dL (ref 6–20)
CALCIUM: 8.9 mg/dL (ref 8.9–10.3)
CHLORIDE: 107 mmol/L (ref 101–111)
CO2: 27 mmol/L (ref 22–32)
Creatinine, Ser: 1.04 mg/dL (ref 0.61–1.24)
GFR calc non Af Amer: >60 mL/min (ref >60)
Glucose, Bld: 90 mg/dL (ref 65–99)
Potassium: 4.2 mmol/L (ref 3.5–5.1)
SODIUM: 141 mmol/L (ref 135–145)

## 2017-06-30 LAB — CBC
HCT: 48.2 % (ref 39.0–52.0)
Hemoglobin: 15.8 g/dL (ref 13.0–17.0)
MCH: 28.4 pg (ref 26.0–34.0)
MCHC: 32.8 g/dL (ref 30.0–36.0)
MCV: 86.7 fL (ref 78.0–100.0)
Platelets: 211 10*3/uL (ref 150–400)
RBC: 5.56 MIL/uL (ref 4.22–5.81)
RDW: 13.8 % (ref 11.5–15.5)
WBC: 5.2 10*3/uL (ref 4.0–10.5)

## 2017-06-30 LAB — URINALYSIS, ROUTINE W REFLEX MICROSCOPIC
Bilirubin Urine: NEGATIVE
Glucose, UA: NEGATIVE mg/dL
Hgb urine dipstick: NEGATIVE
KETONES UR: NEGATIVE mg/dL
LEUKOCYTES UA: NEGATIVE
NITRITE: NEGATIVE
PH: 5 (ref 5.0–8.0)
PROTEIN: NEGATIVE mg/dL
Specific Gravity, Urine: 1.016 (ref 1.005–1.030)

## 2017-06-30 NOTE — Discharge Instructions (Signed)
It was my pleasure taking care of you today!    Ibuprofen as needed for pain. Ice or heat to affected area.   If a rash develops to this area, you will need to follow up with your primary doctor or return to ER as this could be related to shingles. Return to ER for new or worsening symptoms, any additional concerns.

## 2017-06-30 NOTE — ED Triage Notes (Signed)
Pt reports onset last night of left side pain that radiates around to his back and causing sob. Denies recent cough. Denies urinary symptoms.

## 2017-06-30 NOTE — ED Provider Notes (Signed)
[facility]EM[facility]PT Provider Note   CSN: [id number] Arrival date & time: 06/30/17  0865     History   Chief Complaint Chief Complaint  Patient presents with  . Back Pain  . Shortness of Breath    HPI Andrew Nash is a 31 y.o. male.  The history is provided by the patient and medical records. No language interpreter was used.  Shortness of Breath  Pertinent negatives include no cough, no wheezing and no vomiting.   Andrew Nash is an otherwise heatlhy 31 y.o. male  who presents to the Emergency Department complaining of left lower ribcage pain which began last night. Initially pain was not that bad, so patient went to sleep in hopes it would feel better in the morning. When he awoke, pain was much more severe. Patient endorses shortness of breath, stating that when the pain occurs, it hurts so bad it takes his breath away. Patient states that while waiting the three hours in the waiting room, his symptoms actually have very much improved and nearly resolved. No associated n/v. No pain to the chest. No fevers, cough, congestion. No hx of similar. No medications prior to arrival. No leg swelling or pain. No long travel, hormones use, recent surgeries/immobilizations. Does have hx of shingles in the past. Has not done anything out of his ordinary routine, but does do a good bit of heavy lifting at work.    History reviewed. No pertinent past medical history.  There are no active problems to display for this patient.   History reviewed. No pertinent surgical history.     Home Medications    Prior to Admission medications   Medication Sig Start Date End Date Taking? Authorizing Provider  cyclobenzaprine (FLEXERIL) 10 MG tablet Take 1 tablet (10 mg total) by mouth 3 (three) times daily as needed for muscle spasms. Patient not taking: Reported on 05/28/2017 12/13/15   Gilda Crease, MD  diphenhydrAMINE (BENADRYL) 25 MG tablet Take 1 tablet (25 mg total) by mouth 3 (three)  times daily. Take one tablet three times daily for two days 05/28/17   Gerhard Munch, MD  famotidine (PEPCID) 20 MG tablet Take 1 tablet (20 mg total) by mouth 2 (two) times daily. Take one tablet twice daily for two days 05/28/17   Gerhard Munch, MD  ibuprofen (ADVIL,MOTRIN) 800 MG tablet Take 1 tablet (800 mg total) by mouth 3 (three) times daily. Patient not taking: Reported on 05/28/2017 12/13/15   Gilda Crease, MD  traMADol (ULTRAM) 50 MG tablet Take 1 tablet (50 mg total) by mouth every 6 (six) hours as needed. Patient not taking: Reported on 05/28/2017 12/13/15   Gilda Crease, MD    Family History History reviewed. No pertinent family history.  Social History Social History  Substance Use Topics  . Smoking status: Never Smoker  . Smokeless tobacco: Never Used  . Alcohol use Yes     Comment: socialy     Allergies   Patient has no known allergies.   Review of Systems Review of Systems  Respiratory: Positive for shortness of breath. Negative for cough, chest tightness and wheezing.   Gastrointestinal: Negative for constipation, diarrhea, nausea and vomiting.  Genitourinary: Positive for flank pain. Negative for urgency.  All other systems reviewed and are negative.    Physical Exam Updated Vital Signs BP 116/75   Pulse (!) 51   Temp 98.2 F (36.8 C) (Oral)   Resp 13   SpO2 100%   Physical Exam  Constitutional: He is oriented to person, place, and time. He appears well-developed and well-nourished. No distress.  HENT:  Head: Normocephalic and atraumatic.  Cardiovascular: Normal rate, regular rhythm and normal heart sounds.   No murmur heard. Pulmonary/Chest: Effort normal and breath sounds normal. No respiratory distress. He has no wheezes. He has no rales.  No chest wall tenderness. Tenderness to palpation along left lower ribcage / flank. No overlying skin changes / rashes. Speaking in full sentences without difficulty. 100% O2 on RA. Equal chest  expansion.  Abdominal: Soft. He exhibits no distension. There is no tenderness.  Musculoskeletal: He exhibits no edema.  Neurological: He is alert and oriented to person, place, and time.  Skin: Skin is warm and dry.  Nursing note and vitals reviewed.    ED Treatments / Results  Labs (all labs ordered are listed, but only abnormal results are displayed) Labs Reviewed  BASIC METABOLIC PANEL  CBC  URINALYSIS, ROUTINE W REFLEX MICROSCOPIC  I-STAT TROPONIN, ED    EKG  EKG Interpretation  Date/Time:  Wednesday June 30 2017 07:33:38 EDT Ventricular Rate:  69 PR Interval:  126 QRS Duration: 104 QT Interval:  422 QTC Calculation: 452 R Axis:   68 Text Interpretation:  Normal sinus rhythm Normal ECG Confirmed by Vanetta MuldersZackowski, Scott [phone]([id number]) on 06/30/2017 11:09:52 AM       Radiology Dg Chest 2 View  Result Date: 06/30/2017 CLINICAL DATA:  Left-sided chest pain and shortness of Breath EXAM: CHEST  2 VIEW COMPARISON:  06/21/2015 FINDINGS: The heart size and mediastinal contours are within normal limits. Both lungs are clear. The visualized skeletal structures are unremarkable. IMPRESSION: No active cardiopulmonary disease. Electronically Signed   By: Alcide CleverMark  Lukens M.D.   On: 06/30/2017 08:14    Procedures Procedures (including critical care time)  Medications Ordered in ED Medications - No data to display   Initial Impression / Assessment and Plan / ED Course  I have reviewed the triage vital signs and the nursing notes.  Pertinent labs & imaging results that were available during my care of the patient were reviewed by me and considered in my medical decision making (see chart for details).    Andrew Nash is a 31 y.o. male who presents to ED for left lower rib cage pain which began last night. Intermittently will have pain so bad that it takes his breath away. Pain improved while in waiting room. On exam, patient is afebrile, hemodynamically stable with normal cardiopulmonary  exam. Lungs CTA, 100% on RA and speaking in full sentences without difficulty. Tenderness to palpation along left rib cage with no overlying skin changes. Patient has hx of shingles. Possible this could be early shingles. Discussed this with patient and to return or see PCP if rash develops. CXR negative. PERC negative. Heart score of 0. Doubt cardiopulmonary etiology. Patient does lift heavy objects at work although no known injury. Still possible musk etiology. Evaluation does not show pathology that would require ongoing emergent intervention or inpatient treatment. Differentials discussed with patient along with symptomatic home care. Return precautions discussed as well. All questions answered.   Patient discussed with Dr. Deretha EmoryZackowski who agrees with treatment plan.    Final Clinical Impressions(s) / ED Diagnoses   Final diagnoses:  Rib pain on left side    New Prescriptions New Prescriptions   No medications on file     Iasha Mccalister, Chase PicketJaime Pilcher, PA-C 06/30/17 1145    Vanetta MuldersZackowski, Scott, MD 07/05/17 [phone]

## 2017-12-24 ENCOUNTER — Emergency Department (HOSPITAL_COMMUNITY)
Admission: EM | Admit: 2017-12-24 | Discharge: 2017-12-25 | Disposition: A | Discharge status: Home / Self Care | Payer: Self-pay | Attending: Emergency Medicine | Admitting: Emergency Medicine

## 2017-12-24 ENCOUNTER — Other Ambulatory Visit: Payer: Self-pay

## 2017-12-24 ENCOUNTER — Encounter (HOSPITAL_COMMUNITY): Payer: Self-pay | Admitting: Emergency Medicine

## 2017-12-24 DIAGNOSIS — Q828 Other specified congenital malformations of skin: Secondary | ICD-10-CM | POA: Insufficient documentation

## 2017-12-24 DIAGNOSIS — R21 Rash and other nonspecific skin eruption: Secondary | ICD-10-CM | POA: Insufficient documentation

## 2017-12-24 DIAGNOSIS — Z79899 Other long term (current) drug therapy: Secondary | ICD-10-CM | POA: Insufficient documentation

## 2017-12-24 NOTE — ED Triage Notes (Signed)
Pt reports hx of skin disorder with occasional flare ups, recent flare x2 weeks. Rash covering chest and abdomen, radiating down arms. Concerned for infection d/t yellow drainage. Reports lots of burning, concerned that benadryl is making it worse.

## 2017-12-25 LAB — CBC
HCT: 45.6 % (ref 39.0–52.0)
HEMOGLOBIN: 15.5 g/dL (ref 13.0–17.0)
MCH: 29.6 pg (ref 26.0–34.0)
MCHC: 34 g/dL (ref 30.0–36.0)
MCV: 87 fL (ref 78.0–100.0)
Platelets: 235 10*3/uL (ref 150–400)
RBC: 5.24 MIL/uL (ref 4.22–5.81)
RDW: 13.3 % (ref 11.5–15.5)
WBC: 7.1 10*3/uL (ref 4.0–10.5)

## 2017-12-25 LAB — BASIC METABOLIC PANEL
Anion gap: 10 (ref 5–15)
BUN: 10 mg/dL (ref 6–20)
CHLORIDE: 108 mmol/L (ref 101–111)
CO2: 22 mmol/L (ref 22–32)
Calcium: 8.3 mg/dL — ABNORMAL LOW (ref 8.9–10.3)
Creatinine, Ser: 1.02 mg/dL (ref 0.61–1.24)
GFR calc Af Amer: >60 mL/min (ref >60)
GFR calc non Af Amer: >60 mL/min (ref >60)
Glucose, Bld: 124 mg/dL — ABNORMAL HIGH (ref 65–99)
POTASSIUM: 3.9 mmol/L (ref 3.5–5.1)
Sodium: 140 mmol/L (ref 135–145)

## 2017-12-25 MED ORDER — CEPHALEXIN 500 MG PO CAPS: 500.0000 mg | ORAL_CAPSULE | Freq: Two times a day (BID) | ORAL | 0 refills | Status: DC

## 2017-12-25 MED ORDER — PREDNISONE 20 MG PO TABS: 40.0000 mg | ORAL_TABLET | Freq: Every day | ORAL | 0 refills | Status: DC

## 2017-12-25 MED ORDER — HYDROCODONE-ACETAMINOPHEN 5-325 MG PO TABS: 2.0000 | ORAL_TABLET | Freq: Four times a day (QID) | ORAL | 0 refills | Status: DC | PRN

## 2017-12-25 MED ORDER — DEXAMETHASONE SODIUM PHOSPHATE 10 MG/ML IJ SOLN
10.0000 mg | Freq: Once | INTRAMUSCULAR | Status: AC
  Administered 2017-12-25: 10 mg via INTRAMUSCULAR
  Filled 2017-12-25: qty 1

## 2017-12-25 NOTE — Discharge Instructions (Signed)
You need to follow-up with a dermatologist.  Please take medications as prescribed.

## 2017-12-25 NOTE — ED Provider Notes (Signed)
[facility] EMERGENCY DEPARTMENT Provider Note   CSN: [id number] Arrival date & time: 12/24/17  2307     History   Chief Complaint Chief Complaint  Patient presents with  . Rash    HPI Andrew Nash is a 32 y.o. male.  Patient with past medical history remarkable for Draier White disease presents to the emergency department with chief complaint of rash.  He states that this is typical of his skin condition, but is worse than normal.  He states that he has tried topical creams with no relief.  He complains of mild pain.  He denies any fevers or chills.  Denies nausea or vomiting.  Denies shortness of breath, wheezing.  He does not follow with a dermatologist.  He states that he and his family members share the same disease.   The history is provided by the patient. No language interpreter was used.    History reviewed. No pertinent past medical history.  There are no active problems to display for this patient.   History reviewed. No pertinent surgical history.     Home Medications    Prior to Admission medications   Medication Sig Start Date End Date Taking? Authorizing Provider  cephALEXin (KEFLEX) 500 MG capsule Take 1 capsule (500 mg total) by mouth 2 (two) times daily. 12/25/17   Roxy Horseman, PA-C  cyclobenzaprine (FLEXERIL) 10 MG tablet Take 1 tablet (10 mg total) by mouth 3 (three) times daily as needed for muscle spasms. Patient not taking: Reported on 05/28/2017 12/13/15   Gilda Crease, MD  diphenhydrAMINE (BENADRYL) 25 MG tablet Take 1 tablet (25 mg total) by mouth 3 (three) times daily. Take one tablet three times daily for two days 05/28/17   Gerhard Munch, MD  famotidine (PEPCID) 20 MG tablet Take 1 tablet (20 mg total) by mouth 2 (two) times daily. Take one tablet twice daily for two days 05/28/17   Gerhard Munch, MD  HYDROcodone-acetaminophen (NORCO/VICODIN) 5-325 MG tablet Take 2 tablets by mouth every 6 (six) hours as needed.  12/25/17   Roxy Horseman, PA-C  ibuprofen (ADVIL,MOTRIN) 800 MG tablet Take 1 tablet (800 mg total) by mouth 3 (three) times daily. Patient not taking: Reported on 05/28/2017 12/13/15   Gilda Crease, MD  predniSONE (DELTASONE) 20 MG tablet Take 2 tablets (40 mg total) by mouth daily. Take 40 mg by mouth daily for 3 days, then 20mg  by mouth daily for 3 days, then 10mg  daily for 3 days 12/25/17   Roxy Horseman, PA-C  traMADol (ULTRAM) 50 MG tablet Take 1 tablet (50 mg total) by mouth every 6 (six) hours as needed. Patient not taking: Reported on 05/28/2017 12/13/15   Gilda Crease, MD    Family History History reviewed. No pertinent family history.  Social History Social History   Tobacco Use  . Smoking status: Never Smoker  . Smokeless tobacco: Never Used  Substance Use Topics  . Alcohol use: Yes    Comment: socialy  . Drug use: No     Allergies   Patient has no known allergies.   Review of Systems Review of Systems  All other systems reviewed and are negative.    Physical Exam Updated Vital Signs BP (!) 147/88   Pulse 75   Temp 99.3 F (37.4 C) (Oral)   Resp 16   SpO2 95%   Physical Exam  Constitutional: He is oriented to person, place, and time. He appears well-developed and well-nourished.  HENT:  Head:  Normocephalic and atraumatic.  Eyes: Conjunctivae and EOM are normal.  Neck: Normal range of motion.  Cardiovascular: Normal rate.  Pulmonary/Chest: Effort normal.  Abdominal: He exhibits no distension.  Musculoskeletal: Normal range of motion.  Neurological: He is alert and oriented to person, place, and time.  Skin: Skin is dry.  Diffuse erythema on anterior chest wall, with some scattered satellite lesions No sign of abscess  Psychiatric: He has a normal mood and affect. His behavior is normal. Judgment and thought content normal.  Nursing note and vitals reviewed.    ED Treatments / Results  Labs (all labs ordered are listed, but  only abnormal results are displayed) Labs Reviewed  BASIC METABOLIC PANEL - Abnormal; Notable for the following components:      Result Value   Glucose, Bld 124 (*)    Calcium 8.3 (*)    All other components within normal limits  CBC    EKG  EKG Interpretation None       Radiology No results found.  Procedures Procedures (including critical care time)  Medications Ordered in ED Medications  dexamethasone (DECADRON) injection 10 mg (10 mg Intramuscular Given 12/25/17 0122)     Initial Impression / Assessment and Plan / ED Course  I have reviewed the triage vital signs and the nursing notes.  Pertinent labs & imaging results that were available during my care of the patient were reviewed by me and considered in my medical decision making (see chart for details).     Patient with rash on chest wall.  No evidence of abscess.  Possible superimposed infection.  Will treat with steroids and abx.  Advised patient to follow-up with dermatology.  Final Clinical Impressions(s) / ED Diagnoses   Final diagnoses:  Rash    ED Discharge Orders        Ordered    predniSONE (DELTASONE) 20 MG tablet  Daily     12/25/17 0112    cephALEXin (KEFLEX) 500 MG capsule  2 times daily     12/25/17 0112    HYDROcodone-acetaminophen (NORCO/VICODIN) 5-325 MG tablet  Every 6 hours PRN     12/25/17 0113       Roxy HorsemanBrowning, Nahuel, PA-C 12/25/17 0140    Dione BoozeGlick, David, MD 12/25/17 [id number]

## 2018-08-16 ENCOUNTER — Encounter (HOSPITAL_COMMUNITY): Payer: Self-pay | Admitting: Emergency Medicine

## 2018-08-16 ENCOUNTER — Emergency Department (HOSPITAL_COMMUNITY): Payer: Self-pay

## 2018-08-16 ENCOUNTER — Other Ambulatory Visit: Payer: Self-pay

## 2018-08-16 ENCOUNTER — Emergency Department (HOSPITAL_COMMUNITY)
Admission: EM | Admit: 2018-08-16 | Discharge: 2018-08-16 | Disposition: A | Discharge status: Home / Self Care | Payer: Self-pay | Attending: Emergency Medicine | Admitting: Emergency Medicine

## 2018-08-16 DIAGNOSIS — R0789 Other chest pain: Secondary | ICD-10-CM | POA: Insufficient documentation

## 2018-08-16 LAB — CBC
HCT: 50.8 % (ref 39.0–52.0)
Hemoglobin: 16.7 g/dL (ref 13.0–17.0)
MCH: 29.1 pg (ref 26.0–34.0)
MCHC: 32.9 g/dL (ref 30.0–36.0)
MCV: 88.7 fL (ref 78.0–100.0)
Platelets: 230 10*3/uL (ref 150–400)
RBC: 5.73 MIL/uL (ref 4.22–5.81)
RDW: 12.9 % (ref 11.5–15.5)
WBC: 6.8 10*3/uL (ref 4.0–10.5)

## 2018-08-16 LAB — BASIC METABOLIC PANEL
Anion gap: 10 (ref 5–15)
BUN: 15 mg/dL (ref 6–20)
CALCIUM: 9.4 mg/dL (ref 8.9–10.3)
CO2: 29 mmol/L (ref 22–32)
CREATININE: 1.18 mg/dL (ref 0.61–1.24)
Chloride: 106 mmol/L (ref 98–111)
GFR calc Af Amer: >60 mL/min (ref >60)
GFR calc non Af Amer: >60 mL/min (ref >60)
Glucose, Bld: 111 mg/dL — ABNORMAL HIGH (ref 70–99)
Potassium: 4.6 mmol/L (ref 3.5–5.1)
Sodium: 145 mmol/L (ref 135–145)

## 2018-08-16 LAB — I-STAT TROPONIN, ED
TROPONIN I, POC: 0 ng/mL (ref 0.00–0.08)
Troponin i, poc: 0 ng/mL (ref 0.00–0.08)

## 2018-08-16 MED ORDER — KETOROLAC TROMETHAMINE 30 MG/ML IJ SOLN
30.0000 mg | Freq: Once | INTRAMUSCULAR | Status: AC
  Administered 2018-08-16: 30 mg via INTRAMUSCULAR
  Filled 2018-08-16: qty 1

## 2018-08-16 NOTE — ED Provider Notes (Signed)
[facility] EMERGENCY DEPARTMENT Provider Note   CSN: [id number] Arrival date & time: 08/16/18  1651     History   Chief Complaint Chief Complaint  Patient presents with  . Chest Pain  . Shortness of Breath    HPI Andrew Nash is a 32 y.o. male.  Andrew Nash is a 32 y.o. Male who is otherwise healthy, presents to the emergency department for evaluation of chest pain.  Patient reports when he woke up this morning he experienced some intermittent sharp left-sided chest pains with some mild shortness of breath and he felt a little bit lightheadedness, since then the symptoms have resolved.  Chest pain is not worse with exertion and is not pleuritic in nature, no associated lower extremity swelling or tenderness.  No history of PE or DVT, no recent surgeries or long distance travel.  Patient does report having a mild headache intermittently over the past few days.  No associated vision changes, nausea or vomiting, dizziness, numbness or weakness, facial asymmetry or speech changes, he has not taken anything for his headaches or chest pain.  No other aggravating or alleviating factors.  Patient does wonder if his work is contributing, he does a lot of strenuous activity and has been working 3 jobs, also reports he has been under a lot of stress.  No history of heart problems, no history of hypertension, diabetes or hyperlipidemia.  No family history of heart issues.  Patient does not smoke, drink or use drugs.     History reviewed. No pertinent past medical history.  There are no active problems to display for this patient.   History reviewed. No pertinent surgical history.      Home Medications    Prior to Admission medications   Medication Sig Start Date End Date Taking? Authorizing Provider  cephALEXin (KEFLEX) 500 MG capsule Take 1 capsule (500 mg total) by mouth 2 (two) times daily. 12/25/17   Roxy Horseman, PA-C  cyclobenzaprine (FLEXERIL) 10 MG tablet  Take 1 tablet (10 mg total) by mouth 3 (three) times daily as needed for muscle spasms. Patient not taking: Reported on 05/28/2017 12/13/15   Gilda Crease, MD  diphenhydrAMINE (BENADRYL) 25 MG tablet Take 1 tablet (25 mg total) by mouth 3 (three) times daily. Take one tablet three times daily for two days 05/28/17   Gerhard Munch, MD  famotidine (PEPCID) 20 MG tablet Take 1 tablet (20 mg total) by mouth 2 (two) times daily. Take one tablet twice daily for two days 05/28/17   Gerhard Munch, MD  HYDROcodone-acetaminophen (NORCO/VICODIN) 5-325 MG tablet Take 2 tablets by mouth every 6 (six) hours as needed. 12/25/17   Roxy Horseman, PA-C  ibuprofen (ADVIL,MOTRIN) 800 MG tablet Take 1 tablet (800 mg total) by mouth 3 (three) times daily. Patient not taking: Reported on 05/28/2017 12/13/15   Gilda Crease, MD  predniSONE (DELTASONE) 20 MG tablet Take 2 tablets (40 mg total) by mouth daily. Take 40 mg by mouth daily for 3 days, then 20mg  by mouth daily for 3 days, then 10mg  daily for 3 days 12/25/17   Roxy Horseman, PA-C  traMADol (ULTRAM) 50 MG tablet Take 1 tablet (50 mg total) by mouth every 6 (six) hours as needed. Patient not taking: Reported on 05/28/2017 12/13/15   Gilda Crease, MD    Family History No family history on file.  Social History Social History   Tobacco Use  . Smoking status: Never Smoker  . Smokeless tobacco:  Never Used  Substance Use Topics  . Alcohol use: Yes    Comment: socialy  . Drug use: No     Allergies   Patient has no known allergies.   Review of Systems Review of Systems  Constitutional: Negative for chills and fever.  HENT: Negative for congestion, rhinorrhea and sore throat.   Eyes: Negative for visual disturbance.  Respiratory: Positive for shortness of breath. Negative for cough.   Cardiovascular: Positive for chest pain. Negative for palpitations and leg swelling.  Gastrointestinal: Negative for abdominal pain, nausea and  vomiting.  Genitourinary: Negative for dysuria and frequency.  Musculoskeletal: Negative for arthralgias and myalgias.  Skin: Negative for color change and rash.  Neurological: Positive for light-headedness and headaches. Negative for dizziness, syncope and speech difficulty.     Physical Exam Updated Vital Signs BP 125/74 (BP Location: Left Arm)   Pulse 67   Temp 98.4 F (36.9 C) (Oral)   Resp 16   Ht 5\' 11"  (1.803 m)   Wt 93 kg   SpO2 100%   BMI 28.59 kg/m   Physical Exam  Constitutional: He is oriented to person, place, and time. He appears well-developed and well-nourished. He does not appear ill. No distress.  HENT:  Head: Normocephalic and atraumatic.  Eyes: Pupils are equal, round, and reactive to light. EOM are normal. Right eye exhibits no discharge. Left eye exhibits no discharge.  Neck: Normal range of motion. Neck supple. No JVD present. No tracheal deviation present.  Cardiovascular: Normal rate and regular rhythm. Exam reveals no gallop and no friction rub.  No murmur heard. Pulses:      Radial pulses are 2+ on the right side, and 2+ on the left side.       Dorsalis pedis pulses are 2+ on the right side, and 2+ on the left side.       Posterior tibial pulses are 2+ on the right side, and 2+ on the left side.  Pulmonary/Chest: Effort normal and breath sounds normal. No respiratory distress.  Respirations equal and unlabored, patient able to speak in full sentences, lungs clear to auscultation bilaterally  Abdominal: Soft. Bowel sounds are normal. He exhibits no distension and no mass. There is no tenderness. There is no guarding.  Abdomen soft, nondistended, nontender to palpation in all quadrants without guarding or peritoneal signs  Musculoskeletal:       Right lower leg: Normal. He exhibits no tenderness and no edema.       Left lower leg: Normal. He exhibits no tenderness and no edema.  Neurological: He is alert and oriented to person, place, and time.  Coordination normal.  Speech is clear, able to follow commands CN III-XII intact Normal strength in upper and lower extremities bilaterally including dorsiflexion and plantar flexion, strong and equal grip strength Sensation normal to light and sharp touch Moves extremities without ataxia, coordination intact Normal finger to nose and rapid alternating movements No pronator drift  Skin: Skin is warm and dry. Capillary refill takes less than 2 seconds. He is not diaphoretic.  Psychiatric: He has a normal mood and affect. His behavior is normal.  Nursing note and vitals reviewed.    ED Treatments / Results  Labs (all labs ordered are listed, but only abnormal results are displayed) Labs Reviewed  BASIC METABOLIC PANEL - Abnormal; Notable for the following components:      Result Value   Glucose, Bld 111 (*)    All other components within normal limits  CBC  I-STAT TROPONIN, ED  I-STAT TROPONIN, ED    EKG EKG Interpretation  Date/Time:  Tuesday August 16 2018 16:55:33 EDT Ventricular Rate:  75 PR Interval:  126 QRS Duration: 104 QT Interval:  398 QTC Calculation: 444 R Axis:   67 Text Interpretation:  Normal sinus rhythm with sinus arrhythmia Normal ECG No significant change since last tracing Confirmed by Melene PlanFloyd, Dan [phone]) on 08/16/2018 8:35:20 PM   Radiology Dg Chest 2 View  Result Date: 08/16/2018 CLINICAL DATA:  Chest pain, shortness of breath and lightheadedness. EXAM: CHEST - 2 VIEW COMPARISON:  Chest radiograph 06/30/2017 FINDINGS: The heart size and mediastinal contours are within normal limits. Both lungs are clear. The visualized skeletal structures are unremarkable. IMPRESSION: No active cardiopulmonary disease. Electronically Signed   By: Deatra RobinsonKevin  Herman M.D.   On: 08/16/2018 19:11    Procedures Procedures (including critical care time)  Medications Ordered in ED Medications  ketorolac (TORADOL) 30 MG/ML injection 30 mg (30 mg Intramuscular Given 08/16/18  2052)     Initial Impression / Assessment and Plan / ED Course  I have reviewed the triage vital signs and the nursing notes.  Pertinent labs & imaging results that were available during my care of the patient were reviewed by me and considered in my medical decision making (see chart for details).  Presents with intermittent chest pains with associated shortness of breath and lightheadedness that started this morning.  Patient has no cardiac risk factors.  I suspect pain is musculoskeletal, stress from work may be contributing as well, improved with Toradol here in the ED.  He has had a mild headache which also resolved with Toradol, normal neurologic exam.  Chest pain is not likely of cardiac or pulmonary etiology d/t presentation, PERC negative, VSS, no tracheal deviation, no JVD or new murmur, RRR, breath sounds equal bilaterally, EKG without acute abnormalities, negative troponin, and negative CXR. Patient is to be discharged with recommendation to follow up with PCP in regards to today's hospital visit. Pt has been advised to return to the ED if CP becomes exertional, associated with diaphoresis or nausea, radiates to left jaw/arm, worsens or becomes concerning in any way. Pt appears reliable for follow up and is agreeable to discharge.    Final Clinical Impressions(s) / ED Diagnoses   Final diagnoses:  Atypical chest pain    ED Discharge Orders    None       Legrand RamsFord, Ardena Gangl N, PA-C 08/16/18 2318    Melene PlanFloyd, Dan, DO 08/17/18 [id number]

## 2018-08-16 NOTE — Discharge Instructions (Signed)
Your evaluation is reassuring and does not suggest an acute problem with your heart or lungs causing your symptoms.  This may be musculoskeletal or due to stress.  You can use naproxen and Tylenol as needed to treat your symptoms.  Please follow-up with your primary care doctor.  Return to the emergency department for worsening chest pain or shortness of breath especially if this is worse with exertion, if you feel lightheaded or like he might pass out or any other new or concerning symptoms.

## 2018-08-16 NOTE — ED Triage Notes (Signed)
Pt reports intermittent sharp central cp, sob and lightheadedness that started this morning. No radiation. Lungs clear, respirations unlabored.

## 2019-06-05 ENCOUNTER — Other Ambulatory Visit: Payer: Self-pay

## 2019-06-05 ENCOUNTER — Emergency Department (HOSPITAL_COMMUNITY)
Admission: EM | Admit: 2019-06-05 | Discharge: 2019-06-05 | Disposition: A | Discharge status: Home / Self Care | Payer: Self-pay | Attending: Emergency Medicine | Admitting: Emergency Medicine

## 2019-06-05 ENCOUNTER — Encounter (HOSPITAL_COMMUNITY): Payer: Self-pay | Admitting: Emergency Medicine

## 2019-06-05 ENCOUNTER — Emergency Department (HOSPITAL_COMMUNITY): Payer: Self-pay

## 2019-06-05 DIAGNOSIS — M62830 Muscle spasm of back: Secondary | ICD-10-CM | POA: Insufficient documentation

## 2019-06-05 MED ORDER — NAPROXEN 500 MG PO TABS: 500.0000 mg | ORAL_TABLET | Freq: Two times a day (BID) | ORAL | 0 refills | Status: DC

## 2019-06-05 MED ORDER — CYCLOBENZAPRINE HCL 10 MG PO TABS: 10.0000 mg | ORAL_TABLET | Freq: Two times a day (BID) | ORAL | 0 refills | Status: DC | PRN

## 2019-06-05 MED ORDER — KETOROLAC TROMETHAMINE 15 MG/ML IJ SOLN
30.0000 mg | Freq: Once | INTRAMUSCULAR | Status: AC
  Administered 2019-06-05: 30 mg via INTRAMUSCULAR
  Filled 2019-06-05: qty 2

## 2019-06-05 NOTE — ED Triage Notes (Signed)
Pt c/o mid-lower back pains x 3 days. Denies injury or lifting or fall.
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
## 2019-06-05 NOTE — Discharge Instructions (Signed)
Take medications as prescribed. Return to the ED if you start to have worsening symptoms, injuries or falls, losing control of your bowels or bladder, numbness in your legs.

## 2019-08-14 ENCOUNTER — Encounter (HOSPITAL_COMMUNITY): Payer: Self-pay | Admitting: Emergency Medicine

## 2019-08-14 ENCOUNTER — Emergency Department (HOSPITAL_COMMUNITY)
Admission: EM | Admit: 2019-08-14 | Discharge: 2019-08-14 | Disposition: A | Discharge status: Home / Self Care | Payer: Self-pay | Attending: Emergency Medicine | Admitting: Emergency Medicine

## 2019-08-14 ENCOUNTER — Emergency Department (HOSPITAL_COMMUNITY): Payer: Self-pay

## 2019-08-14 ENCOUNTER — Other Ambulatory Visit: Payer: Self-pay

## 2019-08-14 DIAGNOSIS — R197 Diarrhea, unspecified: Secondary | ICD-10-CM | POA: Insufficient documentation

## 2019-08-14 LAB — CBC
HCT: 50.7 % (ref 39.0–52.0)
Hemoglobin: 16.6 g/dL (ref 13.0–17.0)
MCH: 29.2 pg (ref 26.0–34.0)
MCHC: 32.7 g/dL (ref 30.0–36.0)
MCV: 89.1 fL (ref 80.0–100.0)
Platelets: 221 10*3/uL (ref 150–400)
RBC: 5.69 MIL/uL (ref 4.22–5.81)
RDW: 13.2 % (ref 11.5–15.5)
WBC: 5.1 10*3/uL (ref 4.0–10.5)
nRBC: 0 % (ref 0.0–0.2)

## 2019-08-14 LAB — BASIC METABOLIC PANEL
Anion gap: 8 (ref 5–15)
BUN: 17 mg/dL (ref 6–20)
CO2: 27 mmol/L (ref 22–32)
Calcium: 8.8 mg/dL — ABNORMAL LOW (ref 8.9–10.3)
Chloride: 108 mmol/L (ref 98–111)
Creatinine, Ser: 1.02 mg/dL (ref 0.61–1.24)
GFR calc Af Amer: >60 mL/min (ref >60)
GFR calc non Af Amer: >60 mL/min (ref >60)
Glucose, Bld: 98 mg/dL (ref 70–99)
Potassium: 4 mmol/L (ref 3.5–5.1)
Sodium: 143 mmol/L (ref 135–145)

## 2019-08-14 LAB — URINALYSIS, ROUTINE W REFLEX MICROSCOPIC
Bilirubin Urine: NEGATIVE
Glucose, UA: NEGATIVE mg/dL
Hgb urine dipstick: NEGATIVE
Ketones, ur: NEGATIVE mg/dL
Leukocytes,Ua: NEGATIVE
Nitrite: NEGATIVE
Protein, ur: NEGATIVE mg/dL
Specific Gravity, Urine: 1.025 (ref 1.005–1.030)
pH: 6 (ref 5.0–8.0)

## 2019-08-14 LAB — LIPASE, BLOOD: Lipase: 29 U/L (ref 11–51)

## 2019-08-14 LAB — TROPONIN I (HIGH SENSITIVITY): Troponin I (High Sensitivity): 2 ng/L (ref <18)

## 2019-08-14 LAB — CBG MONITORING, ED: Glucose-Capillary: 85 mg/dL (ref 70–99)

## 2019-08-14 MED ORDER — FAMOTIDINE 20 MG PO TABS: 20.0000 mg | ORAL_TABLET | Freq: Two times a day (BID) | ORAL | 0 refills | Status: AC

## 2019-08-14 MED ORDER — SODIUM CHLORIDE 0.9% FLUSH: 3.0000 mL | Freq: Once | INTRAVENOUS | Status: DC

## 2019-08-14 NOTE — ED Provider Notes (Signed)
[facility] DEPT Provider Note   CSN: [id number] Arrival date & time: 08/14/19  0818     History   Chief Complaint Chief Complaint  Patient presents with  . Abdominal Pain  . Dizziness    HPI Andrew Nash is a 33 y.o. male.     33 year old male presents emergency room with complaint of diarrhea for the past 5 to 6 months.  Patient reports up to 4 episodes of loose stools daily, nonbloody, non-mucousy stools.  Patient reports intermittent epigastric abdominal discomfort.  Also reports feeling dizzy or lightheaded at times.  Patient denies any complaints at this time, he is not having abdominal pain or dizziness and currently.  Patient denies fevers, chills, travel within the past year.  Patient has never had an endoscopy or colonoscopy, denies family history of Crohn's disease or IBS.  No other complaints or concerns.     History reviewed. No pertinent past medical history.  There are no active problems to display for this patient.   History reviewed. No pertinent surgical history.      Home Medications    Prior to Admission medications   Medication Sig Start Date End Date Taking? Authorizing Provider  famotidine (PEPCID) 20 MG tablet Take 1 tablet (20 mg total) by mouth 2 (two) times daily. Take one tablet twice daily for two days 08/14/19 09/13/19  Tacy Learn, PA-C  diphenhydrAMINE (BENADRYL) 25 MG tablet Take 1 tablet (25 mg total) by mouth 3 (three) times daily. Take one tablet three times daily for two days Patient not taking: Reported on 08/14/2019 05/28/17 08/14/19  Carmin Muskrat, MD    Family History No family history on file.  Social History Social History   Tobacco Use  . Smoking status: Never Smoker  . Smokeless tobacco: Never Used  Substance Use Topics  . Alcohol use: Yes    Comment: socialy  . Drug use: No     Allergies   Patient has no known allergies.   Review of Systems Review of Systems   Constitutional: Negative for fever.  Respiratory: Negative for shortness of breath.   Cardiovascular: Negative for chest pain.  Gastrointestinal: Positive for abdominal pain and diarrhea. Negative for constipation, nausea and vomiting.  Genitourinary: Negative for dysuria, frequency and urgency.  Musculoskeletal: Negative for arthralgias, back pain and myalgias.  Skin: Negative for rash and wound.  Allergic/Immunologic: Negative for immunocompromised state.  Neurological: Positive for dizziness. Negative for headaches.  Hematological: Negative for adenopathy.  Psychiatric/Behavioral: Negative for confusion.  All other systems reviewed and are negative.    Physical Exam Updated Vital Signs BP 120/84   Pulse 62   Temp 98.9 F (37.2 C) (Oral)   Resp 16   Ht 5\' 11"  (1.803 m)   Wt 88.5 kg   SpO2 96%   BMI 27.20 kg/m   Physical Exam Vitals signs and nursing note reviewed.  Constitutional:      General: He is not in acute distress.    Appearance: He is well-developed. He is not diaphoretic.  HENT:     Head: Normocephalic and atraumatic.  Cardiovascular:     Rate and Rhythm: Normal rate and regular rhythm.     Heart sounds: Normal heart sounds.  Pulmonary:     Effort: Pulmonary effort is normal.     Breath sounds: Normal breath sounds.  Abdominal:     General: Bowel sounds are normal.     Palpations: Abdomen is soft.     Tenderness: There  is no abdominal tenderness. There is no right CVA tenderness or left CVA tenderness.  Genitourinary:    Scrotum/Testes:        Right: Swelling not present.        Left: Swelling not present.  Skin:    General: Skin is warm and dry.     Findings: No rash.  Neurological:     Mental Status: He is alert and oriented to person, place, and time.  Psychiatric:        Behavior: Behavior normal.      ED Treatments / Results  Labs (all labs ordered are listed, but only abnormal results are displayed) Labs Reviewed  BASIC METABOLIC  PANEL - Abnormal; Notable for the following components:      Result Value   Calcium 8.8 (*)    All other components within normal limits  CBC  LIPASE, BLOOD  URINALYSIS, ROUTINE W REFLEX MICROSCOPIC  CBG MONITORING, ED  TROPONIN I (HIGH SENSITIVITY)    EKG EKG Interpretation  Date/Time:  Monday August 14 2019 08:56:27 EDT Ventricular Rate:  62 PR Interval:    QRS Duration: 99 QT Interval:  430 QTC Calculation: 437 R Axis:   65 Text Interpretation:  Sinus rhythm No significant change since last tracing Confirmed by Linwood Dibbles [phone]) on 08/14/2019 9:17:31 AM   Radiology Dg Chest 2 View  Result Date: 08/14/2019 CLINICAL DATA:  Upper abdominal pain and diarrhea over the last 2 months. EXAM: CHEST - 2 VIEW COMPARISON:  08/16/2018 FINDINGS: Heart size is normal. Mediastinal shadows are normal. The lungs are clear. No bronchial thickening. No infiltrate, mass, effusion or collapse. Pulmonary vascularity is normal. No bony abnormality. IMPRESSION: Normal chest Electronically Signed   By: Paulina Fusi M.D.   On: 08/14/2019 09:21    Procedures Procedures (including critical care time)  Medications Ordered in ED Medications  sodium chloride flush (NS) 0.9 % injection 3 mL (3 mLs Intravenous Not Given 08/14/19 1054)     Initial Impression / Assessment and Plan / ED Course  I have reviewed the triage vital signs and the nursing notes.  Pertinent labs & imaging results that were available during my care of the patient were reviewed by me and considered in my medical decision making (see chart for details).  Clinical Course as of Aug 13 1210  Mon Aug 14, 2019  316 33 year old male with report of diarrhea up to 4 times daily for the past 5 to 6 months.  Described as nonbloody, no recent travel, no known sick contacts.  Occasionally associated with abdominal pain.  Patient reports feeling dizzy or lightheaded earlier.  Denies any symptoms at this time. EKG is unremarkable, troponin  x1-.  Urinalysis normal, CBC and BMP without significant findings, lipase normal.  Orthostatic vital signs are negative/normal.  Patient is referred to GI for follow-up, also given number for [facility] and wellness to establish care.  Patient may take Imodium or Pepto-Bismol as needed, consider eliminating dairy from diet.   [LM]    Clinical Course User Index [LM] Jeannie Fend, PA-C      Final Clinical Impressions(s) / ED Diagnoses   Final diagnoses:  Diarrhea, unspecified type    ED Discharge Orders         Ordered    famotidine (PEPCID) 20 MG tablet  2 times daily     08/14/19 1203           Alden Hipp 08/14/19 1211    Linwood Dibbles,  MD 08/15/19 [id number]

## 2019-08-14 NOTE — ED Triage Notes (Signed)
Pt complaint upper mid abd pain with diarrhea intermittent for 5 months. Pt also verbalizing dizziness/lightheadedness in the am.

## 2019-08-14 NOTE — Discharge Instructions (Addendum)
Your labwork today is reassuring. Your electrolytes are normal, kidney and liver function are normal. No signs of infection or dehydration. Your chest xray is normal. Take Pepcid daily. Take Imodium or Pepto bismal as needed as directed. Consider dietary changes to see if eliminating dairy from your diet has any effect on your symptoms. You would need to eliminate all dairy for at least 6 weeks to see effect.   Referral given to follow up with GI. Also information for [facility] and [facility] to establish care with a primary care provider.

## 2019-10-14 IMAGING — CR DG CHEST 2V
2 series · 2 of 2 positions shown · non-contrast
Comparison: 08/16/2018

CLINICAL DATA: Upper abdominal pain and diarrhea over the last 2
months.

EXAM:
CHEST - 2 VIEW

[w chest pa]
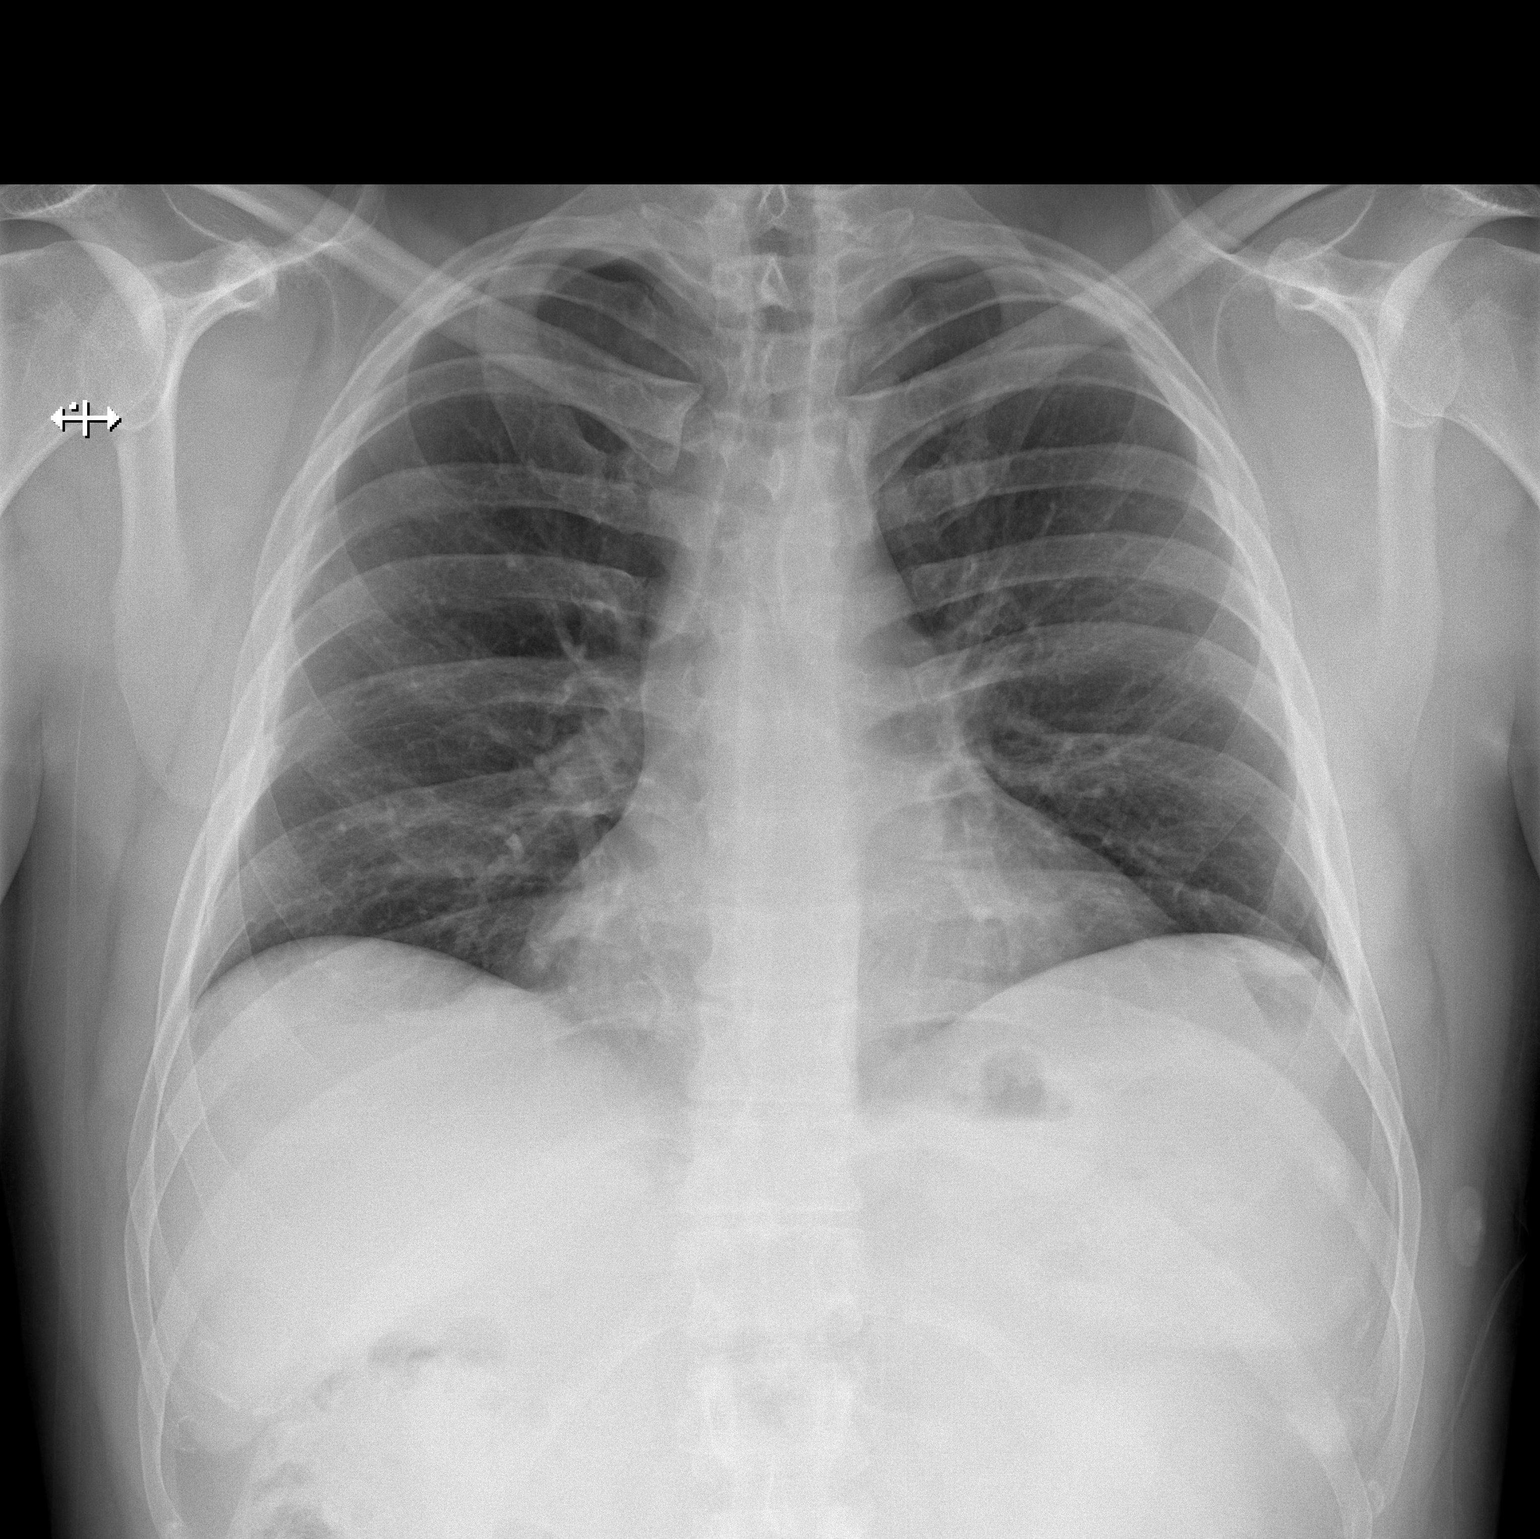

[w chest lat]
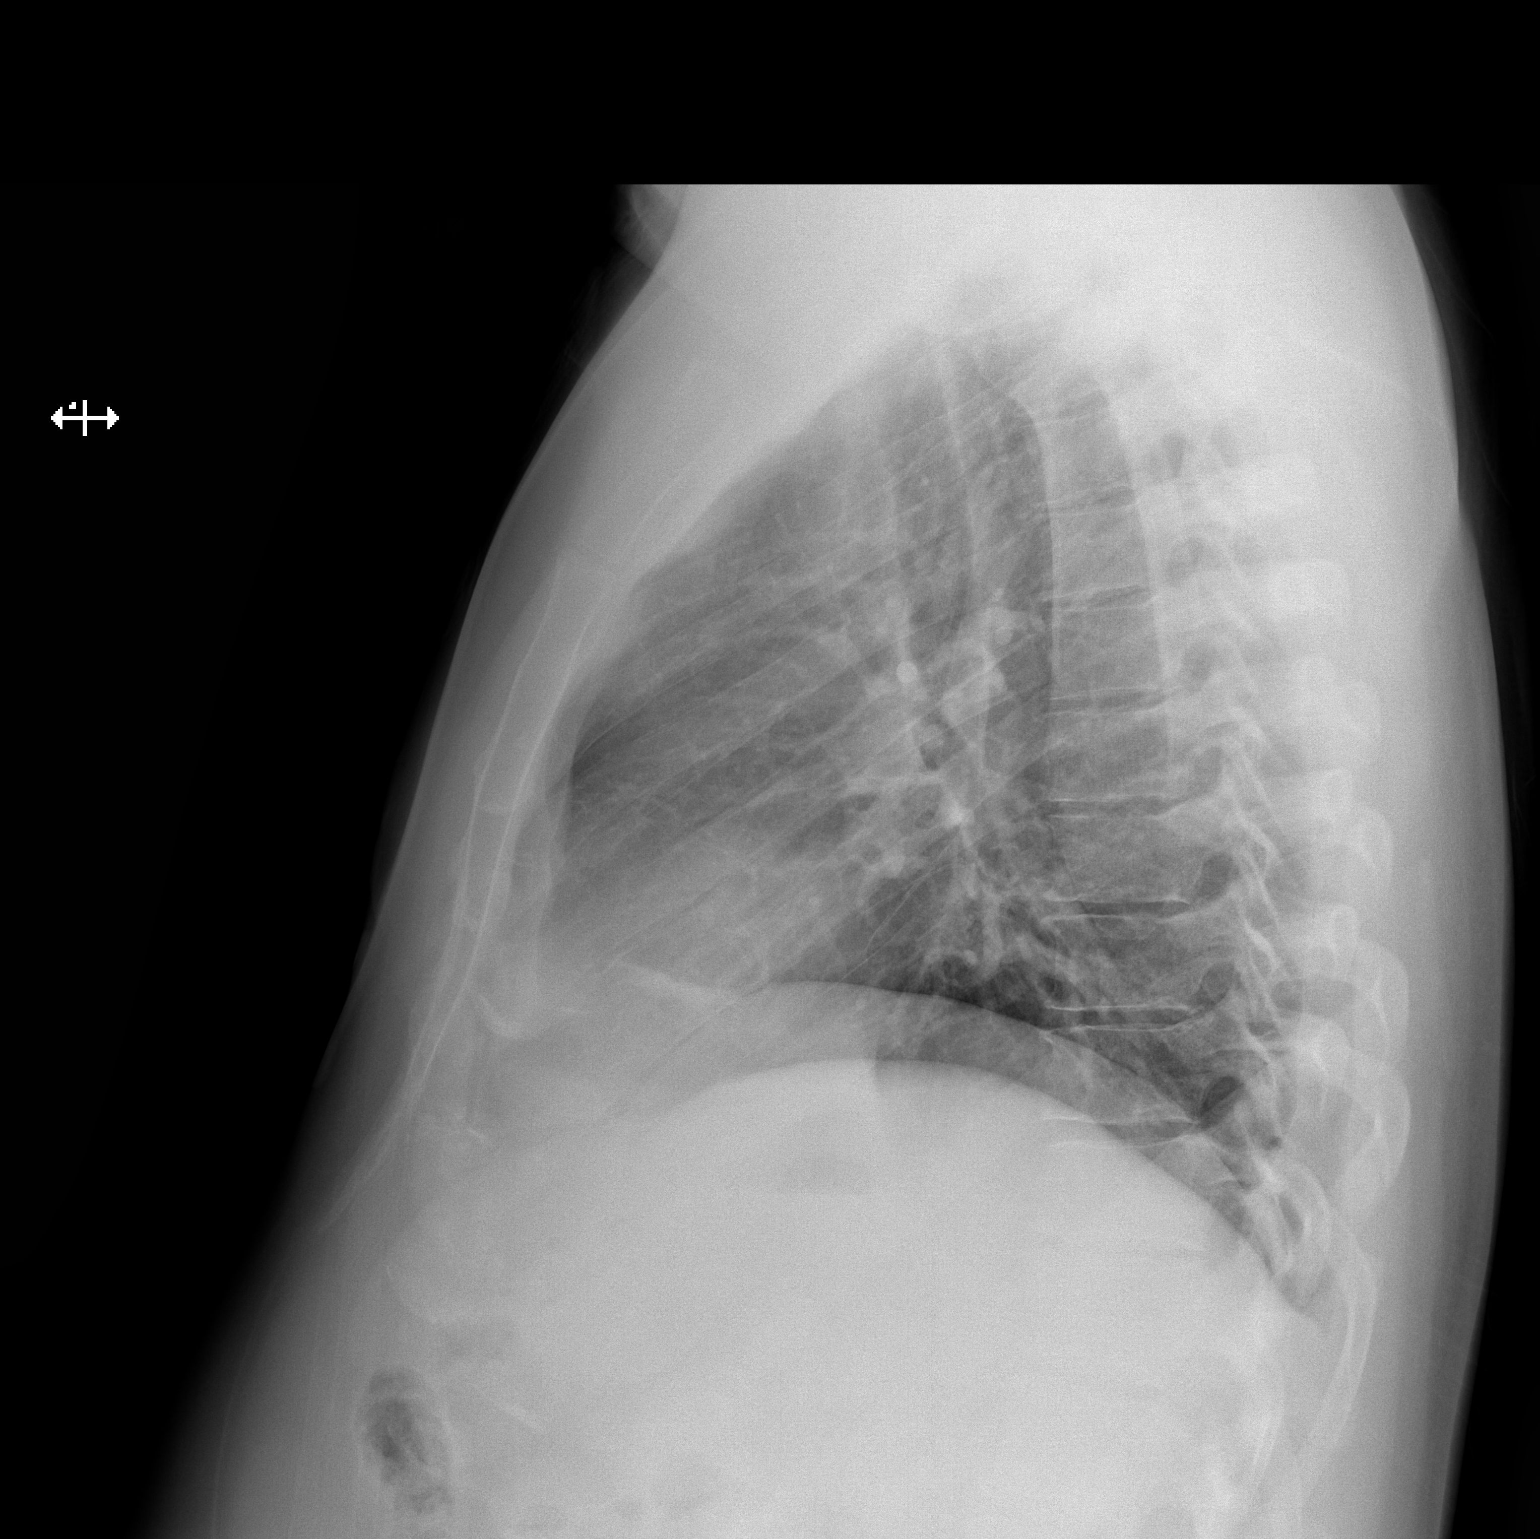

[2 of 2 positions shown; findings below may reference images not displayed]

FINDINGS: Heart size is normal. Mediastinal shadows are normal. The lungs are
clear. No bronchial thickening. No infiltrate, mass, effusion or
collapse. Pulmonary vascularity is normal. No bony abnormality.
IMPRESSION: Normal chest
